# Patient Record
Sex: Female | Born: 1975 | Race: White | Hispanic: No | Marital: Single | Smoking: Never smoker
Health system: Southern US, Community
[De-identification: ages and names within clinical notes are randomized; demographics above are authoritative.]

## PROBLEM LIST (undated history)

## (undated) DIAGNOSIS — G4730 Sleep apnea, unspecified: Secondary | ICD-10-CM

## (undated) HISTORY — PX: CHOLECYSTECTOMY: SHX55

## (undated) HISTORY — DX: Sleep apnea, unspecified: G47.30

## (undated) HISTORY — PX: THYROID SURGERY: SHX805

## (undated) HISTORY — PX: TONSILLECTOMY: SUR1361

## (undated) HISTORY — PX: ABDOMINAL HYSTERECTOMY: SHX81

## (undated) HISTORY — PX: OOPHORECTOMY: SHX86

---

## 2005-07-18 ENCOUNTER — Ambulatory Visit: Payer: Self-pay | Admitting: Unknown Physician Specialty

## 2005-12-01 ENCOUNTER — Ambulatory Visit: Payer: Self-pay | Admitting: Unknown Physician Specialty

## 2006-01-10 ENCOUNTER — Ambulatory Visit: Payer: Self-pay | Admitting: Unknown Physician Specialty

## 2006-11-07 ENCOUNTER — Ambulatory Visit: Payer: Self-pay | Admitting: Unknown Physician Specialty

## 2007-04-03 ENCOUNTER — Ambulatory Visit: Payer: Self-pay | Admitting: Unknown Physician Specialty

## 2007-07-26 ENCOUNTER — Emergency Department: Payer: Self-pay | Admitting: Emergency Medicine

## 2007-09-28 ENCOUNTER — Ambulatory Visit: Payer: Self-pay | Admitting: Family Medicine

## 2007-10-30 ENCOUNTER — Ambulatory Visit: Payer: Self-pay | Admitting: Unknown Physician Specialty

## 2012-06-15 ENCOUNTER — Ambulatory Visit: Payer: Self-pay | Admitting: Unknown Physician Specialty

## 2012-10-09 ENCOUNTER — Ambulatory Visit: Payer: Self-pay | Admitting: Specialist

## 2012-10-15 ENCOUNTER — Ambulatory Visit: Payer: Self-pay | Admitting: Specialist

## 2012-11-09 ENCOUNTER — Ambulatory Visit: Payer: Self-pay | Admitting: Oncology

## 2012-12-08 ENCOUNTER — Ambulatory Visit: Payer: Self-pay | Admitting: Oncology

## 2014-08-08 ENCOUNTER — Other Ambulatory Visit: Payer: Self-pay | Admitting: Specialist

## 2014-08-08 ENCOUNTER — Ambulatory Visit
Admission: RE | Admit: 2014-08-08 | Discharge: 2014-08-08 | Disposition: A | Discharge status: Home / Self Care | Payer: 59 | Source: Ambulatory Visit | Attending: Specialist | Admitting: Specialist

## 2014-08-15 ENCOUNTER — Ambulatory Visit: Payer: 59 | Attending: Otolaryngology

## 2014-08-15 DIAGNOSIS — G4733 Obstructive sleep apnea (adult) (pediatric): Secondary | ICD-10-CM | POA: Insufficient documentation

## 2014-08-15 DIAGNOSIS — R0683 Snoring: Secondary | ICD-10-CM | POA: Insufficient documentation

## 2014-09-19 ENCOUNTER — Ambulatory Visit: Payer: 59 | Attending: Otolaryngology

## 2014-09-19 DIAGNOSIS — G4733 Obstructive sleep apnea (adult) (pediatric): Secondary | ICD-10-CM | POA: Insufficient documentation

## 2014-09-19 DIAGNOSIS — Z90710 Acquired absence of both cervix and uterus: Secondary | ICD-10-CM | POA: Diagnosis not present

## 2014-09-19 DIAGNOSIS — R0683 Snoring: Secondary | ICD-10-CM | POA: Insufficient documentation

## 2014-09-19 DIAGNOSIS — Z9049 Acquired absence of other specified parts of digestive tract: Secondary | ICD-10-CM | POA: Diagnosis not present

## 2014-09-25 ENCOUNTER — Ambulatory Visit: Payer: 59 | Admitting: Oncology

## 2014-10-07 ENCOUNTER — Encounter: Payer: Self-pay | Admitting: Oncology

## 2014-10-07 ENCOUNTER — Inpatient Hospital Stay: Payer: 59 | Attending: Oncology | Admitting: Oncology

## 2014-10-07 VITALS — BP 138/91 | HR 111 | Temp 96.7°F | Resp 20 | Ht 63.0 in | Wt 284.9 lb

## 2014-10-07 DIAGNOSIS — G4730 Sleep apnea, unspecified: Secondary | ICD-10-CM | POA: Diagnosis not present

## 2014-10-07 DIAGNOSIS — R748 Abnormal levels of other serum enzymes: Secondary | ICD-10-CM | POA: Insufficient documentation

## 2014-10-07 DIAGNOSIS — Z79899 Other long term (current) drug therapy: Secondary | ICD-10-CM

## 2014-10-07 DIAGNOSIS — R7989 Other specified abnormal findings of blood chemistry: Secondary | ICD-10-CM | POA: Diagnosis not present

## 2014-10-07 NOTE — Progress Notes (Signed)
Patient here today for new evaluation regarding elevated ferritin. Patient needs clearance prior to having bariatric surgery in the fall.

## 2014-10-12 NOTE — Progress Notes (Signed)
[facility]  [phone] Fax:([phone]) [phone]  ID: Tammy Soto OB: 27-Mar-1975  MR#: [id number]  AOZ#:[id number]  Patient Care Team: Patrice Paradise, MD as PCP - General (Physician Assistant)  CHIEF COMPLAINT:  Chief Complaint  Patient presents with  . New Evaluation    elevated ferritin, clearance for bariatric surgery    INTERVAL HISTORY: Patient last evaluated in clinic in October 2014. At that point, her bariatric surgery was delayed. Her surgery is now been rescheduled and she is referred back for a persistently elevated ferritin. She currently feels well and is asymptomatic.  She has no neurologic complaints.  She denies any recent fevers or illnesses.  She has a good appetite and denies weight loss.  She has no night sweats.  She denies any chest pain or shortness of breath.  She denies any nausea, vomiting, constipation, or diarrhea.  She has no urinary complaints.  Patient feels at her baseline and offers no specific complaints today.    REVIEW OF SYSTEMS:   Review of Systems  Constitutional: Negative.   Musculoskeletal: Negative.   Neurological: Negative.     As per HPI. Otherwise, a complete review of systems is negatve.  PAST MEDICAL HISTORY: Past Medical History  Diagnosis Date  . Sleep apnea     PAST SURGICAL HISTORY: Past Surgical History  Procedure Laterality Date  . Abdominal hysterectomy    . Cholecystectomy    . Tonsillectomy      FAMILY HISTORY: CAD, thyroid disease.     ADVANCED DIRECTIVES:    HEALTH MAINTENANCE: Social History  Substance Use Topics  . Smoking status: Not on file  . Smokeless tobacco: Not on file  . Alcohol Use: Not on file     Colonoscopy:  PAP:  Bone density:  Lipid panel:  Not on File  Current Outpatient Prescriptions  Medication Sig Dispense Refill  . cholecalciferol (VITAMIN D) 1000 UNITS tablet Take 1,000 Units by mouth daily.     No current facility-administered  medications for this visit.    OBJECTIVE: Filed Vitals:   10/07/14 1616  BP: 138/91  Pulse: 111  Temp: 96.7 F (35.9 C)  Resp: 20     Body mass index is 50.48 kg/(m^2).    ECOG FS:0 - Asymptomatic  General: Well-developed, well-nourished, no acute distress. Eyes: Pink conjunctiva, anicteric sclera. Lungs: Clear to auscultation bilaterally. Heart: Regular rate and rhythm. No rubs, murmurs, or gallops. Abdomen: Soft, nontender, nondistended. No organomegaly noted, normoactive bowel sounds. Musculoskeletal: No edema, cyanosis, or clubbing. Neuro: Alert, answering all questions appropriately. Cranial nerves grossly intact. Skin: No rashes or petechiae noted. Psych: Normal affect.   LAB RESULTS:  No results found for: NA, K, CL, CO2, GLUCOSE, BUN, CREATININE, CALCIUM, PROT, ALBUMIN, AST, ALT, ALKPHOS, BILITOT, GFRNONAA, GFRAA  No results found for: WBC, NEUTROABS, HGB, HCT, MCV, PLT   STUDIES: No results found.  ASSESSMENT: Elevated ferritin.  PLAN:    1. Elevated ferritin: Patient's ferritin level is only mildly elevated at 354 which is essentially unchanged from September 2014 when her ferritin measured 329. The remainder of her laboratory work including hemachromatosis gene mutation is either negative or within normal limits. Unclear etiology of elevated ferritin, but possibly be related to fatty infiltration of liver.  No intervention is needed. Okay to proceed with surgery from a hematology standpoint. No follow-up has been scheduled.  2. Elevated liver enzymes: Possibly related to fatty infiltration, monitor.  Approximately 30 minutes was spent in discussion and consultation.  Patient  expressed understanding and was in agreement with this plan. She also understands that She can call clinic at any time with any questions, concerns, or complaints.     Jeralyn Ruths, MD   10/12/2014 9:53 AM

## 2014-10-24 ENCOUNTER — Encounter: Payer: Self-pay | Admitting: *Deleted

## 2014-10-27 ENCOUNTER — Encounter: Payer: Self-pay | Admitting: *Deleted

## 2014-10-27 ENCOUNTER — Ambulatory Visit
Admission: RE | Admit: 2014-10-27 | Discharge: 2014-10-27 | Disposition: A | Discharge status: Home / Self Care | Payer: 59 | Source: Ambulatory Visit | Attending: Unknown Physician Specialty | Admitting: Unknown Physician Specialty

## 2014-10-27 ENCOUNTER — Ambulatory Visit: Payer: 59 | Admitting: Anesthesiology

## 2014-10-27 ENCOUNTER — Encounter
Admission: RE | Disposition: A | Discharge status: Home / Self Care | Payer: Self-pay | Source: Ambulatory Visit | Attending: Unknown Physician Specialty

## 2014-10-27 DIAGNOSIS — Z6841 Body Mass Index (BMI) 40.0 and over, adult: Secondary | ICD-10-CM | POA: Diagnosis not present

## 2014-10-27 DIAGNOSIS — Z79899 Other long term (current) drug therapy: Secondary | ICD-10-CM | POA: Diagnosis not present

## 2014-10-27 DIAGNOSIS — K319 Disease of stomach and duodenum, unspecified: Secondary | ICD-10-CM | POA: Diagnosis not present

## 2014-10-27 DIAGNOSIS — Z01818 Encounter for other preprocedural examination: Secondary | ICD-10-CM | POA: Diagnosis present

## 2014-10-27 DIAGNOSIS — Z90710 Acquired absence of both cervix and uterus: Secondary | ICD-10-CM | POA: Diagnosis not present

## 2014-10-27 DIAGNOSIS — G4730 Sleep apnea, unspecified: Secondary | ICD-10-CM | POA: Insufficient documentation

## 2014-10-27 DIAGNOSIS — Z9049 Acquired absence of other specified parts of digestive tract: Secondary | ICD-10-CM | POA: Insufficient documentation

## 2014-10-27 HISTORY — PX: ESOPHAGOGASTRODUODENOSCOPY (EGD) WITH PROPOFOL: SHX5813

## 2014-10-27 SURGERY — ESOPHAGOGASTRODUODENOSCOPY (EGD) WITH PROPOFOL
Anesthesia: General

## 2014-10-27 MED ORDER — LIDOCAINE HCL (PF) 2 % IJ SOLN
INTRAMUSCULAR | Status: DC | PRN
  Administered 2014-10-27: 50 mg

## 2014-10-27 MED ORDER — MIDAZOLAM HCL 5 MG/5ML IJ SOLN
INTRAMUSCULAR | Status: DC | PRN
  Administered 2014-10-27: 1 mg via INTRAVENOUS

## 2014-10-27 MED ORDER — GLYCOPYRROLATE 0.2 MG/ML IJ SOLN
INTRAMUSCULAR | Status: DC | PRN
  Administered 2014-10-27: 0.2 mg via INTRAVENOUS

## 2014-10-27 MED ORDER — PROPOFOL INFUSION 10 MG/ML OPTIME
INTRAVENOUS | Status: DC | PRN
  Administered 2014-10-27: 100 ug/kg/min via INTRAVENOUS

## 2014-10-27 MED ORDER — PROPOFOL 10 MG/ML IV BOLUS
INTRAVENOUS | Status: DC | PRN
  Administered 2014-10-27: 50 mg via INTRAVENOUS

## 2014-10-27 MED ORDER — SODIUM CHLORIDE 0.9 % IV SOLN: INTRAVENOUS | Status: DC

## 2014-10-27 MED ORDER — FENTANYL CITRATE (PF) 100 MCG/2ML IJ SOLN
INTRAMUSCULAR | Status: DC | PRN
  Administered 2014-10-27: 50 ug via INTRAVENOUS

## 2014-10-27 MED ORDER — SODIUM CHLORIDE 0.9 % IV SOLN
INTRAVENOUS | Status: DC
Start: 2014-10-27 — End: 2014-10-27
  Administered 2014-10-27: 11:00:00 via INTRAVENOUS

## 2014-10-27 NOTE — H&P (Signed)
   Primary Care Physician:  Patrice Paradise, MD Primary Gastroenterologist:  Dr. Mechele Collin  Pre-Procedure History & Physical: HPI:  Tammy Soto is a 39 y.o. female is here for an pre op upper endoscopy for bariatric surgery.   Past Medical History  Diagnosis Date  . Sleep apnea     Past Surgical History  Procedure Laterality Date  . Abdominal hysterectomy    . Cholecystectomy    . Tonsillectomy    . Thyroid surgery      Prior to Admission medications   Medication Sig Start Date End Date Taking? Authorizing Provider  cholecalciferol (VITAMIN D) 1000 UNITS tablet Take 1,000 Units by mouth daily.    Historical Provider, MD    Allergies as of 10/08/2014  . (Not on File)    History reviewed. No pertinent family history.  Social History   Social History  . Marital Status: Single    Spouse Name: N/A  . Number of Children: N/A  . Years of Education: N/A   Occupational History  . Not on file.   Social History Main Topics  . Smoking status: Never Smoker   . Smokeless tobacco: Never Used  . Alcohol Use: No  . Drug Use: No  . Sexual Activity: Not on file   Other Topics Concern  . Not on file   Social History Narrative    Review of Systems: See HPI, otherwise negative ROS  Physical Exam: BP 126/84 mmHg  Pulse 76  Temp(Src) 97.8 F (36.6 C) (Tympanic)  Resp 18  Ht  (1.6 m)  Wt 127.914 kg (282 lb)  BMI 49.97 kg/m2  SpO2 98% General:   Alert,  pleasant and cooperative in NAD Head:  Normocephalic and atraumatic. Neck:  Supple; no masses or thyromegaly. Lungs:  Clear throughout to auscultation.    Heart:  Regular rate and rhythm. Abdomen:  Soft, nontender and nondistended. Normal bowel sounds, without guarding, and without rebound.  Obesity in abdomen. Neurologic:  Alert and  oriented x4;  grossly normal neurologically.  Impression/Plan: Tammy Soto is here for an endoscopy to be performed for pre op evaluation for bariatric  surgery  Risks, benefits, limitations, and alternatives regarding  endoscopy have been reviewed with the patient.  Questions have been answered.  All parties agreeable.   Lynnae Prude, MD  10/27/2014, 11:22 AM

## 2014-10-27 NOTE — Op Note (Addendum)
[facility] Patient Name: Tammy Soto Procedure Date: 10/27/2014 11:16 AM MRN: [id number] Account #: [id number] Date of Birth: 1975/12/19 Admit Type: Outpatient Age: 39 Room: [facility] ENDO ROOM 1 Gender: Female Note Status: Supervisor Override Procedure:         Upper GI endoscopy Indications:       Preoperative assessment for bariatric surgery to treat                     morbid obesity Providers:         Scot Jun, MD Referring MD:      Marilynne Halsted, MD (Referring MD) Medicines:         Propofol per Anesthesia Complications:     No immediate complications. Procedure:         Pre-Anesthesia Assessment:                    - After reviewing the risks and benefits, the patient was                     deemed in satisfactory condition to undergo the procedure.                    After obtaining informed consent, the endoscope was passed                     under direct vision. Throughout the procedure, the                     patient's blood pressure, pulse, and oxygen saturations                     were monitored continuously. The Endoscope was introduced                     through the mouth, and advanced to the second part of                     duodenum. The upper GI endoscopy was accomplished without                     difficulty. The patient tolerated the procedure well. Findings:      The examined esophagus was normal. Biopsies were taken with a cold       forceps for histology. GEJ 39-40cm.      Diffuse mildly-minimal erythematous mucosa without bleeding was found in       the gastric body. Biopsies were taken with a cold forceps for histology.       Biopsies were taken with a cold forceps for Helicobacter pylori testing.       Done from antrum, body greater and lesser curvature.      The examined duodenum was normal. Impression:        - Normal esophagus. Biopsied.                    - Erythematous mucosa in the gastric  body. Biopsied.                    - Normal examined duodenum. Recommendation:    - Await pathology results. Scot Jun, MD 10/27/2014 11:43:43 AM This report has been signed electronically. Number of Addenda: 0 Note Initiated On: 10/27/2014 11:16 AM      [facility]

## 2014-10-27 NOTE — Transfer of Care (Signed)
Immediate Anesthesia Transfer of Care Note  Patient: Tammy Soto  Procedure(s) Performed: Procedure(s): ESOPHAGOGASTRODUODENOSCOPY (EGD) WITH PROPOFOL (N/A)  Patient Location: PACU  Anesthesia Type:General  Level of Consciousness: sedated  Airway & Oxygen Therapy: Patient Spontanous Breathing and Patient connected to nasal cannula oxygen  Post-op Assessment: Report given to RN and Post -op Vital signs reviewed and stable  Post vital signs: Reviewed and stable  Last Vitals:  Filed Vitals:   10/27/14 1108  BP: 126/84  Pulse: 76  Temp: 36.6 C  Resp: 18    Complications: No apparent anesthesia complications

## 2014-10-27 NOTE — Anesthesia Preprocedure Evaluation (Signed)
Anesthesia Evaluation  Patient identified by MRN, date of birth, ID band Patient awake    Reviewed: Allergy & Precautions, NPO status , Patient's Chart, lab work & pertinent test results  Airway Mallampati: I  TM Distance: >3 FB Neck ROM: Full    Dental  (+) Teeth Intact   Pulmonary sleep apnea ,  Does not have her CPAP unit yet.   breath sounds clear to auscultation       Cardiovascular negative cardio ROS Normal cardiovascular exam Rhythm:Regular Rate:Normal     Neuro/Psych    GI/Hepatic negative GI ROS, BMI 50, pre-op screening for gastric sleeve surgery.   Endo/Other  negative endocrine ROS  Renal/GU      Musculoskeletal   Abdominal (+) + obese,   Peds  Hematology   Anesthesia Other Findings   Reproductive/Obstetrics                             Anesthesia Physical Anesthesia Plan  ASA: III  Anesthesia Plan: General   Post-op Pain Management:    Induction: Intravenous  Airway Management Planned: Nasal Cannula  Additional Equipment:   Intra-op Plan:   Post-operative Plan:   Informed Consent:   Plan Discussed with: CRNA  Anesthesia Plan Comments:         Anesthesia Quick Evaluation

## 2014-10-27 NOTE — Anesthesia Postprocedure Evaluation (Signed)
  Anesthesia Post-op Note  Patient: Tammy Soto  Procedure(s) Performed: Procedure(s): ESOPHAGOGASTRODUODENOSCOPY (EGD) WITH PROPOFOL (N/A)  Anesthesia type:General  Patient location: PACU  Post pain: Pain level controlled  Post assessment: Post-op Vital signs reviewed, Patient's Cardiovascular Status Stable, Respiratory Function Stable, Patent Airway and No signs of Nausea or vomiting  Post vital signs: Reviewed and stable  Last Vitals:  Filed Vitals:   10/27/14 1200  BP: 103/71  Pulse: 68  Temp:   Resp: 16    Level of consciousness: awake, alert  and patient cooperative  Complications: No apparent anesthesia complications

## 2014-10-28 LAB — SURGICAL PATHOLOGY

## 2014-10-29 ENCOUNTER — Encounter: Payer: Self-pay | Admitting: Unknown Physician Specialty

## 2015-09-11 ENCOUNTER — Other Ambulatory Visit: Payer: Self-pay | Admitting: Student

## 2015-09-11 DIAGNOSIS — R945 Abnormal results of liver function studies: Principal | ICD-10-CM

## 2015-09-11 DIAGNOSIS — K760 Fatty (change of) liver, not elsewhere classified: Secondary | ICD-10-CM

## 2015-09-11 DIAGNOSIS — R7989 Other specified abnormal findings of blood chemistry: Secondary | ICD-10-CM

## 2015-09-17 ENCOUNTER — Ambulatory Visit
Admission: RE | Admit: 2015-09-17 | Discharge: 2015-09-17 | Disposition: A | Discharge status: Home / Self Care | Payer: 59 | Source: Ambulatory Visit | Attending: Student | Admitting: Student

## 2015-09-17 DIAGNOSIS — R7989 Other specified abnormal findings of blood chemistry: Secondary | ICD-10-CM | POA: Diagnosis present

## 2015-09-17 DIAGNOSIS — R932 Abnormal findings on diagnostic imaging of liver and biliary tract: Secondary | ICD-10-CM | POA: Diagnosis not present

## 2015-09-17 DIAGNOSIS — Z9049 Acquired absence of other specified parts of digestive tract: Secondary | ICD-10-CM | POA: Insufficient documentation

## 2015-09-17 DIAGNOSIS — R945 Abnormal results of liver function studies: Secondary | ICD-10-CM

## 2015-09-17 DIAGNOSIS — K760 Fatty (change of) liver, not elsewhere classified: Secondary | ICD-10-CM | POA: Diagnosis not present

## 2016-09-08 ENCOUNTER — Other Ambulatory Visit: Payer: Self-pay | Admitting: Physician Assistant

## 2016-09-08 DIAGNOSIS — Z1231 Encounter for screening mammogram for malignant neoplasm of breast: Secondary | ICD-10-CM

## 2016-11-28 ENCOUNTER — Other Ambulatory Visit: Payer: Self-pay | Admitting: Physician Assistant

## 2016-11-28 ENCOUNTER — Ambulatory Visit
Admission: RE | Admit: 2016-11-28 | Discharge: 2016-11-28 | Disposition: A | Discharge status: Home / Self Care | Payer: 59 | Source: Ambulatory Visit | Attending: Physician Assistant | Admitting: Physician Assistant

## 2016-11-28 DIAGNOSIS — Z1231 Encounter for screening mammogram for malignant neoplasm of breast: Secondary | ICD-10-CM | POA: Insufficient documentation

## 2017-10-19 ENCOUNTER — Ambulatory Visit
Admission: RE | Admit: 2017-10-19 | Discharge: 2017-10-19 | Disposition: A | Discharge status: Home / Self Care | Payer: Managed Care, Other (non HMO) | Source: Ambulatory Visit | Attending: Physician Assistant | Admitting: Physician Assistant

## 2017-10-19 ENCOUNTER — Other Ambulatory Visit: Payer: Self-pay | Admitting: Physician Assistant

## 2017-10-19 DIAGNOSIS — R202 Paresthesia of skin: Secondary | ICD-10-CM

## 2017-10-19 DIAGNOSIS — R448 Other symptoms and signs involving general sensations and perceptions: Secondary | ICD-10-CM

## 2017-10-20 ENCOUNTER — Ambulatory Visit
Admission: RE | Admit: 2017-10-20 | Discharge: 2017-10-20 | Disposition: A | Discharge status: Home / Self Care | Payer: Managed Care, Other (non HMO) | Source: Ambulatory Visit | Attending: Physician Assistant | Admitting: Physician Assistant

## 2017-10-20 DIAGNOSIS — R202 Paresthesia of skin: Secondary | ICD-10-CM | POA: Insufficient documentation

## 2017-10-20 DIAGNOSIS — G319 Degenerative disease of nervous system, unspecified: Secondary | ICD-10-CM | POA: Insufficient documentation

## 2017-10-20 DIAGNOSIS — R448 Other symptoms and signs involving general sensations and perceptions: Secondary | ICD-10-CM | POA: Insufficient documentation

## 2017-11-24 ENCOUNTER — Other Ambulatory Visit: Payer: Self-pay | Admitting: Physician Assistant

## 2017-11-24 DIAGNOSIS — Z1231 Encounter for screening mammogram for malignant neoplasm of breast: Secondary | ICD-10-CM

## 2017-12-19 ENCOUNTER — Ambulatory Visit
Admission: RE | Admit: 2017-12-19 | Discharge: 2017-12-19 | Disposition: A | Discharge status: Home / Self Care | Payer: Managed Care, Other (non HMO) | Source: Ambulatory Visit | Attending: Physician Assistant | Admitting: Physician Assistant

## 2017-12-19 DIAGNOSIS — Z1231 Encounter for screening mammogram for malignant neoplasm of breast: Secondary | ICD-10-CM | POA: Insufficient documentation

## 2018-01-01 ENCOUNTER — Other Ambulatory Visit: Payer: Self-pay | Admitting: Neurology

## 2018-01-01 DIAGNOSIS — R200 Anesthesia of skin: Secondary | ICD-10-CM

## 2018-01-17 ENCOUNTER — Ambulatory Visit: Admission: RE | Admit: 2018-01-17 | Payer: Managed Care, Other (non HMO) | Source: Ambulatory Visit

## 2018-11-06 ENCOUNTER — Other Ambulatory Visit: Payer: Self-pay | Admitting: Physician Assistant

## 2018-11-06 DIAGNOSIS — Z1231 Encounter for screening mammogram for malignant neoplasm of breast: Secondary | ICD-10-CM

## 2018-12-26 ENCOUNTER — Other Ambulatory Visit: Payer: Self-pay

## 2018-12-26 ENCOUNTER — Ambulatory Visit
Admission: RE | Admit: 2018-12-26 | Discharge: 2018-12-26 | Disposition: A | Discharge status: Home / Self Care | Payer: Managed Care, Other (non HMO) | Source: Ambulatory Visit | Attending: Physician Assistant | Admitting: Physician Assistant

## 2018-12-26 DIAGNOSIS — Z1231 Encounter for screening mammogram for malignant neoplasm of breast: Secondary | ICD-10-CM | POA: Insufficient documentation

## 2018-12-26 DIAGNOSIS — Z20822 Contact with and (suspected) exposure to covid-19: Secondary | ICD-10-CM

## 2018-12-28 ENCOUNTER — Telehealth: Payer: Self-pay

## 2018-12-28 LAB — NOVEL CORONAVIRUS, NAA: SARS-CoV-2, NAA: DETECTED — AB

## 2018-12-28 NOTE — Telephone Encounter (Signed)
Pt notified of positive COVID-19 test results. Pt verbalized understanding. Pt reports that that she has cough and sinus drainage.Pt advised to remain in self quarantine until at least 10 days since symptom onset And 3 consecutive days fever free without antipyretics And improvement in respiratory symptoms. Patient advised to utilize over the counter medications to treat symptoms. Pt advised to seek treatment in the ED if respiratory issues/distress develops.Pt advised they should only leave home to seek and medical care and must wear a mask in public. Pt instructed to limit contact with family members or caregivers in the home. Pt advised to practice social distancing and to continue to use good preventative care measures such has frequent hand washing, staying out of crowds and cleaning hard surfaces frequently touched in the home.Pt informed that the health department will likely follow up and may have additional recommendations. Will notify [facility] Department.

## 2019-02-20 ENCOUNTER — Emergency Department: Payer: Managed Care, Other (non HMO)

## 2019-02-20 ENCOUNTER — Encounter: Payer: Self-pay | Admitting: Emergency Medicine

## 2019-02-20 ENCOUNTER — Other Ambulatory Visit: Payer: Self-pay

## 2019-02-20 ENCOUNTER — Emergency Department
Admission: EM | Admit: 2019-02-20 | Discharge: 2019-02-20 | Disposition: A | Discharge status: Home / Self Care | Payer: Managed Care, Other (non HMO) | Attending: Emergency Medicine | Admitting: Emergency Medicine

## 2019-02-20 DIAGNOSIS — Z79899 Other long term (current) drug therapy: Secondary | ICD-10-CM | POA: Insufficient documentation

## 2019-02-20 DIAGNOSIS — R079 Chest pain, unspecified: Secondary | ICD-10-CM | POA: Insufficient documentation

## 2019-02-20 DIAGNOSIS — M79662 Pain in left lower leg: Secondary | ICD-10-CM | POA: Insufficient documentation

## 2019-02-20 DIAGNOSIS — R2242 Localized swelling, mass and lump, left lower limb: Secondary | ICD-10-CM | POA: Diagnosis present

## 2019-02-20 LAB — BASIC METABOLIC PANEL
Anion gap: 6 (ref 5–15)
BUN: 14 mg/dL (ref 6–20)
CO2: 28 mmol/L (ref 22–32)
Calcium: 9.3 mg/dL (ref 8.9–10.3)
Chloride: 106 mmol/L (ref 98–111)
Creatinine, Ser: 0.78 mg/dL (ref 0.44–1.00)
GFR calc Af Amer: >60 mL/min (ref >60)
GFR calc non Af Amer: >60 mL/min (ref >60)
Glucose, Bld: 92 mg/dL (ref 70–99)
Potassium: 4.2 mmol/L (ref 3.5–5.1)
Sodium: 140 mmol/L (ref 135–145)

## 2019-02-20 LAB — CBC
HCT: 40.2 % (ref 36.0–46.0)
Hemoglobin: 13.3 g/dL (ref 12.0–15.0)
MCH: 29.1 pg (ref 26.0–34.0)
MCHC: 33.1 g/dL (ref 30.0–36.0)
MCV: 88 fL (ref 80.0–100.0)
Platelets: 211 10*3/uL (ref 150–400)
RBC: 4.57 MIL/uL (ref 3.87–5.11)
RDW: 12.2 % (ref 11.5–15.5)
WBC: 4.8 10*3/uL (ref 4.0–10.5)
nRBC: 0 % (ref 0.0–0.2)

## 2019-02-20 LAB — TROPONIN I (HIGH SENSITIVITY): Troponin I (High Sensitivity): <2 ng/L (ref <18)

## 2019-02-20 NOTE — ED Triage Notes (Signed)
Pt reports yesterday started with swelling to her left leg frokm her knee down and also has some tightness in her chest. Pt reports occasional tingling in her right arm. Pt reports went to [facility] and was brought to ED.

## 2019-02-20 NOTE — ED Provider Notes (Signed)
[facility] Emergency Department Provider Note  ____________________________________________  Time seen: Approximately 8:06 PM  I have reviewed the triage vital signs and the nursing notes.   HISTORY  Chief Complaint Leg Swelling and Chest Pain    HPI Tammy Soto is a 44 y.o. female with no significant past medical history who reports being in her usual state of health until yesterday when she started having some swelling in her left leg and noticing pain in the left anterior shin extending down to the ankle.  Hurts to point ankle down or up.  No alleviating factors.  Pain is constant.  No calf pain.  No significant acute chest pain or shortness of breath contrary to triage note -she does get an occasional brief tightness in her chest which has been a chronic issue for her and not changed recently.   Tried elevating the leg last night without relief.  No recent surgeries or long travel or immobility.     Past Medical History:  Diagnosis Date  . Sleep apnea      There are no problems to display for this patient.    Past Surgical History:  Procedure Laterality Date  . ABDOMINAL HYSTERECTOMY    . CHOLECYSTECTOMY    . ESOPHAGOGASTRODUODENOSCOPY (EGD) WITH PROPOFOL N/A 10/27/2014   Procedure: ESOPHAGOGASTRODUODENOSCOPY (EGD) WITH PROPOFOL;  Surgeon: Scot Jun, MD;  Location: [facility] ENDOSCOPY;  Service: Endoscopy;  Laterality: N/A;  . OOPHORECTOMY    . THYROID SURGERY    . TONSILLECTOMY       Prior to Admission medications   Medication Sig Start Date End Date Taking? Authorizing Provider  cholecalciferol (VITAMIN D) 1000 UNITS tablet Take 1,000 Units by mouth daily.    [provider]     Allergies Patient has no known allergies.   Family History  Problem Relation Age of Onset  . Breast cancer Other     Social History Social History   Tobacco Use  . Smoking status: Never Smoker  . Smokeless tobacco: Never Used   Substance Use Topics  . Alcohol use: No  . Drug use: No    Review of Systems  Constitutional:   No fever or chills.  ENT:   No sore throat. No rhinorrhea. Cardiovascular:   No chest pain or syncope. Respiratory:   No dyspnea or cough. Gastrointestinal:   Negative for abdominal pain, vomiting and diarrhea.  Musculoskeletal:   Left lower leg pain as above All other systems reviewed and are negative except as documented above in ROS and HPI.  ____________________________________________   PHYSICAL EXAM:  VITAL SIGNS: ED Triage Vitals  Enc Vitals Group     BP 02/20/19 1646 110/72     Pulse Rate 02/20/19 1646 70     Resp 02/20/19 1646 20     Temp 02/20/19 1646 98.5 F (36.9 C)     Temp Source 02/20/19 1646 Oral     SpO2 02/20/19 1646 100 %     Weight 02/20/19 1631 174 lb (78.9 kg)     Height 02/20/19 1631 5\' 3"  (1.6 m)     Head Circumference --      Peak Flow --      Pain Score 02/20/19 1631 8     Pain Loc --      Pain Edu? --      Excl. in GC? --     Vital signs reviewed, nursing assessments reviewed.   Constitutional:   Alert and oriented. Non-toxic appearance. Eyes:  Conjunctivae are normal on left.  Right eye bulbar conjunctiva with hazy erythema consistent with irritant conjunctivitis.  No purulent discharge, cornea does not appear steamy. EOMI and painless.  ENT      Head:   Normocephalic and atraumatic.      Nose:   Wearing a mask.      Mouth/Throat:   Wearing a mask.      Neck:   No meningismus. Full ROM. Hematological/Lymphatic/Immunilogical:   No cervical lymphadenopathy. Cardiovascular:   RRR. Symmetric bilateral radial and DP pulses.  No murmurs. Cap refill less than 2 seconds. Respiratory:   Normal respiratory effort without tachypnea/retractions. Breath sounds are clear and equal bilaterally. No wheezes/rales/rhonchi. Gastrointestinal:   Soft and nontender. Non distended. There is no CVA tenderness.  No rebound, rigidity, or guarding. Musculoskeletal:    Normal range of motion in all extremities. No joint effusions.   Nonpitting edema bilateral lower extremities, symmetric calf circumference, no calf tenderness.  There is tenderness along the left anterior shin just medial to the tibia which reproduces her pain.  Passive plantarflexion reproduces pain.  Isokinetic dorsiflexion against resistance reproduces pain.  Compartments are soft. Neurologic:   Normal speech and language.  Motor grossly intact. No acute focal neurologic deficits are appreciated.  Skin:    Skin is warm, dry and intact. No rash noted.  No petechiae, purpura, or bullae.  ____________________________________________    LABS (pertinent positives/negatives) (all labs ordered are listed, but only abnormal results are displayed) Labs Reviewed  BASIC METABOLIC PANEL  CBC  TROPONIN I (HIGH SENSITIVITY)  TROPONIN I (HIGH SENSITIVITY)   ____________________________________________   EKG  Interpreted by me Sinus rhythm rate of 77, normal axis and intervals.  Normal QRS ST segments and T waves.  Baseline wander in the limb leads limits interpretation, but grossly no ischemic changes.  ____________________________________________    RADIOLOGY  DG Chest 2 View  Result Date: 02/20/2019 CLINICAL DATA:  Chest pain EXAM: CHEST - 2 VIEW COMPARISON:  08/08/2014 FINDINGS: Heart and mediastinal contours are within normal limits. No focal opacities or effusions. No acute bony abnormality. IMPRESSION: No active cardiopulmonary disease. Electronically Signed   By: Charlett Nose M.D.   On: 02/20/2019 17:56   US Venous Img Lower Unilateral Left  Result Date: 02/20/2019 CLINICAL DATA:  Swelling and pain of left leg EXAM: LEFT LOWER EXTREMITY VENOUS DOPPLER ULTRASOUND TECHNIQUE: Gray-scale sonography with graded compression, as well as color Doppler and duplex ultrasound were performed to evaluate the lower extremity deep venous systems from the level of the common femoral vein and  including the common femoral, femoral, profunda femoral, popliteal and calf veins including the posterior tibial, peroneal and gastrocnemius veins when visible. The superficial great saphenous vein was also interrogated. Spectral Doppler was utilized to evaluate flow at rest and with distal augmentation maneuvers in the common femoral, femoral and popliteal veins. COMPARISON:  None. FINDINGS: Contralateral Common Femoral Vein: Respiratory phasicity is normal and symmetric with the symptomatic side. No evidence of thrombus. Normal compressibility. Common Femoral Vein: No evidence of thrombus. Normal compressibility, respiratory phasicity and response to augmentation. Saphenofemoral Junction: No evidence of thrombus. Normal compressibility and flow on color Doppler imaging. Profunda Femoral Vein: No evidence of thrombus. Normal compressibility and flow on color Doppler imaging. Femoral Vein: No evidence of thrombus. Normal compressibility, respiratory phasicity and response to augmentation. Popliteal Vein: No evidence of thrombus. Normal compressibility, respiratory phasicity and response to augmentation. Calf Veins: No evidence of thrombus. Normal compressibility and flow on color  Doppler imaging. Superficial Great Saphenous Vein: No evidence of thrombus. Normal compressibility. Venous Reflux:  None. Other Findings:  None. IMPRESSION: No evidence of deep venous thrombosis. Electronically Signed   By: Rolm Baptise M.D.   On: 02/20/2019 17:47    ____________________________________________   PROCEDURES Procedures  ____________________________________________  DIFFERENTIAL DIAGNOSIS   Electrolyte disturbance, DVT, musculoskeletal pain  CLINICAL IMPRESSION / ASSESSMENT AND PLAN / ED COURSE  Medications ordered in the ED: Medications - No data to display  Pertinent labs & imaging results that were available during my care of the patient were reviewed by me and considered in my medical decision making  (see chart for details).  Brier G Giannelli was evaluated in Emergency Department on 02/20/2019 for the symptoms described in the history of present illness. She was evaluated in the context of the global COVID-19 pandemic, which necessitated consideration that the patient might be at risk for infection with the SARS-CoV-2 virus that causes COVID-19. Institutional protocols and algorithms that pertain to the evaluation of patients at risk for COVID-19 are in a state of rapid change based on information released by regulatory bodies including the CDC and federal and state organizations. These policies and algorithms were followed during the patient's care in the ED.   Patient presents with left lower leg pain.  Vital signs are normal, exam is reassuring and suggestive of musculoskeletal origin.  No focal inflammatory changes or unilateral swelling.  Ultrasound is negative for DVT.  Chest x-ray and lab panel also normal.  I think patient is stable for discharge home, heat therapy and NSAIDs, follow-up with primary care doctor.      ____________________________________________   FINAL CLINICAL IMPRESSION(S) / ED DIAGNOSES    Final diagnoses:  Pain in left shin     ED Discharge Orders    None      Portions of this note were generated with dragon dictation software. Dictation errors may occur despite best attempts at proofreading.   Carrie Mew, MD 02/20/19 2013

## 2019-02-20 NOTE — ED Notes (Signed)
Pt c/o left leg swelling and chest tightness x2 days. Pt denies and n/v.

## 2019-10-29 ENCOUNTER — Other Ambulatory Visit: Payer: Self-pay | Admitting: Physician Assistant

## 2019-10-29 DIAGNOSIS — Z1231 Encounter for screening mammogram for malignant neoplasm of breast: Secondary | ICD-10-CM

## 2019-12-27 ENCOUNTER — Ambulatory Visit
Admission: RE | Admit: 2019-12-27 | Discharge: 2019-12-27 | Disposition: A | Discharge status: Home / Self Care | Payer: Managed Care, Other (non HMO) | Source: Ambulatory Visit | Attending: Physician Assistant | Admitting: Physician Assistant

## 2019-12-27 ENCOUNTER — Other Ambulatory Visit: Payer: Self-pay

## 2019-12-27 DIAGNOSIS — Z1231 Encounter for screening mammogram for malignant neoplasm of breast: Secondary | ICD-10-CM | POA: Diagnosis present

## 2020-05-19 LAB — SURGICAL PATHOLOGY

## 2020-10-15 ENCOUNTER — Other Ambulatory Visit: Payer: Self-pay | Admitting: Physician Assistant

## 2020-11-02 ENCOUNTER — Other Ambulatory Visit: Payer: Self-pay | Admitting: Family Medicine

## 2020-11-02 DIAGNOSIS — Z1231 Encounter for screening mammogram for malignant neoplasm of breast: Secondary | ICD-10-CM

## 2021-01-04 ENCOUNTER — Other Ambulatory Visit: Payer: Self-pay

## 2021-01-04 ENCOUNTER — Ambulatory Visit
Admission: RE | Admit: 2021-01-04 | Discharge: 2021-01-04 | Disposition: A | Discharge status: Home / Self Care | Payer: Managed Care, Other (non HMO) | Source: Ambulatory Visit | Attending: Family Medicine | Admitting: Family Medicine

## 2021-01-04 DIAGNOSIS — Z1231 Encounter for screening mammogram for malignant neoplasm of breast: Secondary | ICD-10-CM | POA: Diagnosis not present

## 2021-03-29 ENCOUNTER — Other Ambulatory Visit: Payer: Self-pay | Admitting: Internal Medicine

## 2021-03-31 LAB — SURGICAL PATHOLOGY

## 2021-10-20 ENCOUNTER — Other Ambulatory Visit: Payer: Self-pay | Admitting: Physician Assistant

## 2021-10-20 DIAGNOSIS — Z1231 Encounter for screening mammogram for malignant neoplasm of breast: Secondary | ICD-10-CM

## 2022-01-05 ENCOUNTER — Ambulatory Visit
Admission: RE | Admit: 2022-01-05 | Discharge: 2022-01-05 | Disposition: A | Discharge status: Home / Self Care | Payer: Managed Care, Other (non HMO) | Source: Ambulatory Visit | Attending: Physician Assistant | Admitting: Physician Assistant

## 2022-01-05 DIAGNOSIS — Z1231 Encounter for screening mammogram for malignant neoplasm of breast: Secondary | ICD-10-CM | POA: Diagnosis present

## 2022-01-11 ENCOUNTER — Other Ambulatory Visit: Payer: Self-pay | Admitting: Physician Assistant

## 2022-01-11 DIAGNOSIS — N63 Unspecified lump in unspecified breast: Secondary | ICD-10-CM

## 2022-01-11 DIAGNOSIS — R928 Other abnormal and inconclusive findings on diagnostic imaging of breast: Secondary | ICD-10-CM

## 2022-01-13 ENCOUNTER — Ambulatory Visit
Admission: RE | Admit: 2022-01-13 | Discharge: 2022-01-13 | Disposition: A | Discharge status: Home / Self Care | Payer: Managed Care, Other (non HMO) | Source: Ambulatory Visit | Attending: Physician Assistant | Admitting: Physician Assistant

## 2022-01-13 DIAGNOSIS — R928 Other abnormal and inconclusive findings on diagnostic imaging of breast: Secondary | ICD-10-CM | POA: Diagnosis present

## 2022-01-13 DIAGNOSIS — N63 Unspecified lump in unspecified breast: Secondary | ICD-10-CM | POA: Diagnosis present

## 2022-06-28 IMAGING — MG MM DIGITAL SCREENING BILAT W/ TOMO AND CAD
8 series · 8 of 24 positions shown · non-contrast
Comparison: Previous exam(s).

CLINICAL DATA: Screening.

EXAM:
DIGITAL SCREENING BILATERAL MAMMOGRAM WITH TOMOSYNTHESIS AND CAD
TECHNIQUE: Bilateral screening digital craniocaudal and mediolateral oblique
mammograms were obtained. Bilateral screening digital breast
tomosynthesis was performed. The images were evaluated with
computer-aided detection.

[L CC synth-2D]
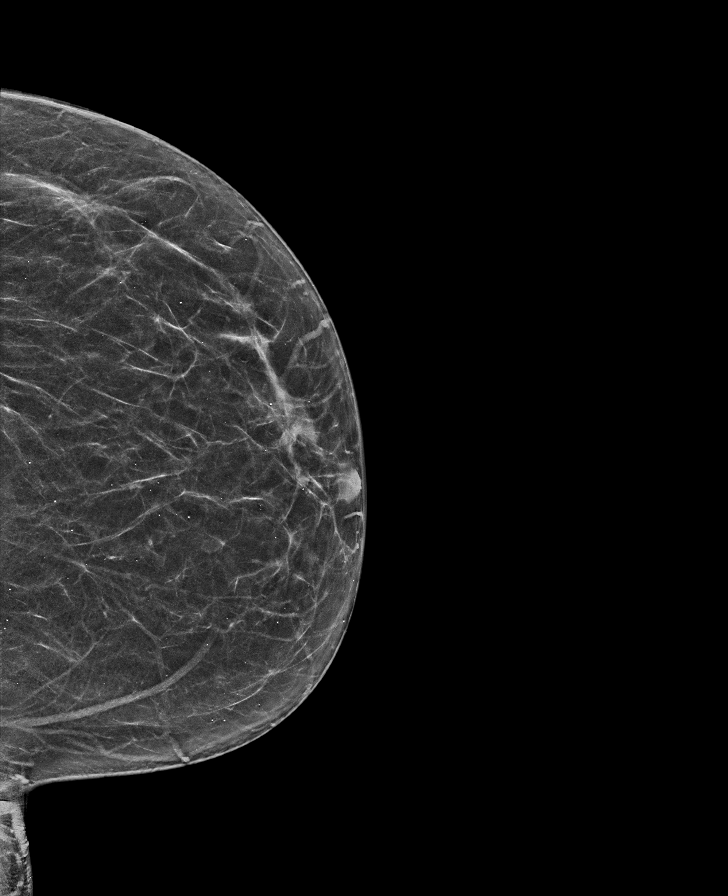

[L MLO synth-2D]
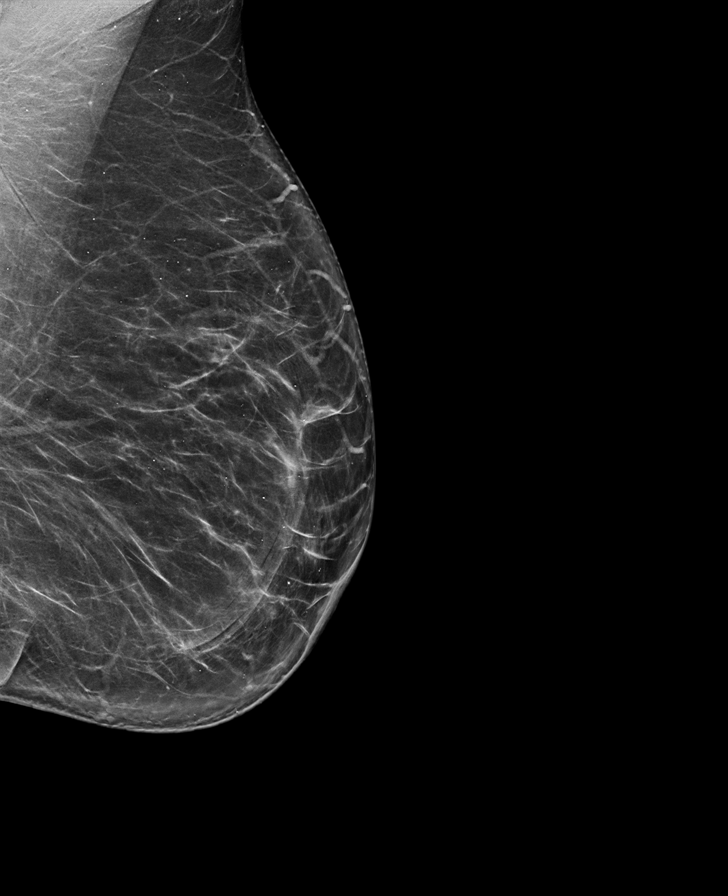

[R MLO synth-2D]
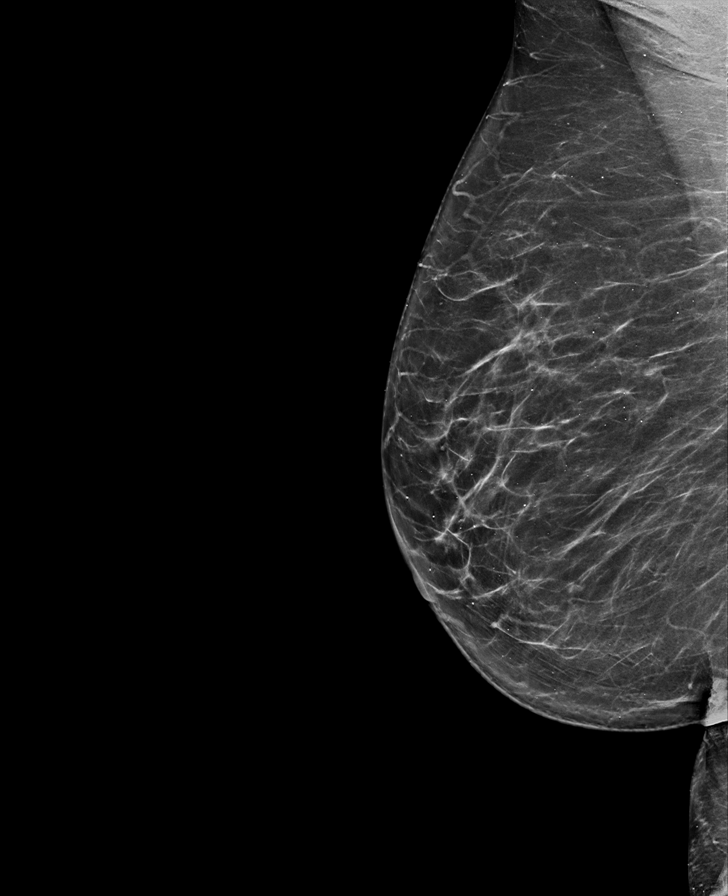

[R CC synth-2D]
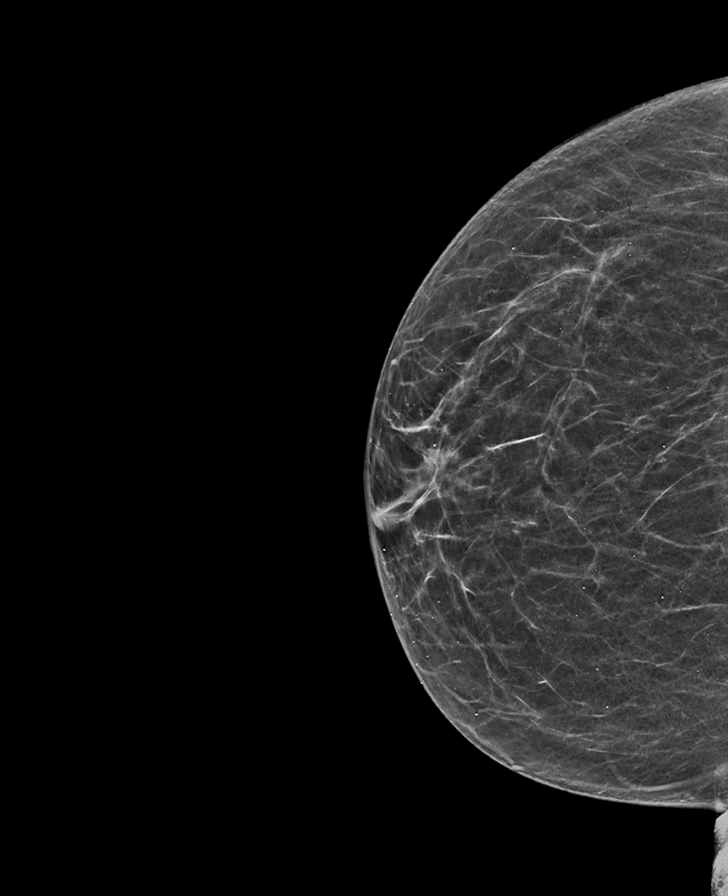

[R CC tomo · tomo slice 31/60.0]
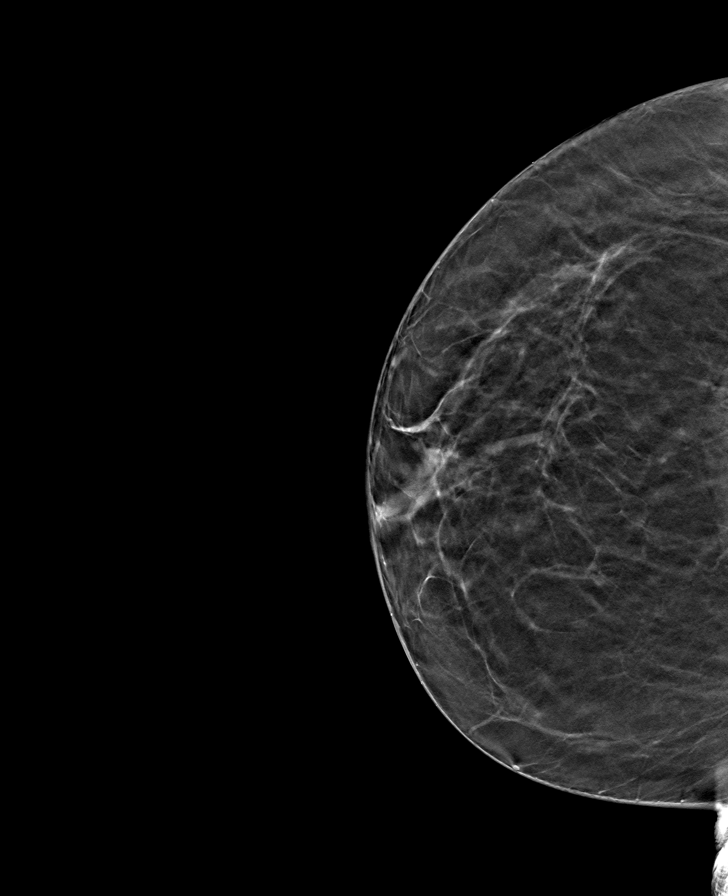

[R MLO tomo · tomo slice 41/81.0]
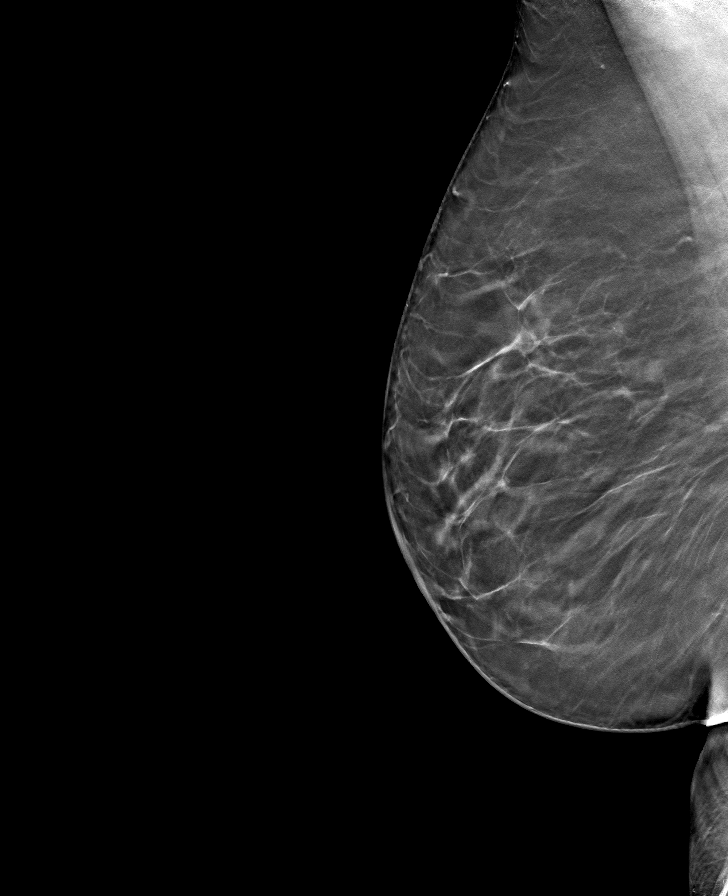

[L MLO tomo · tomo slice 42/83.0]
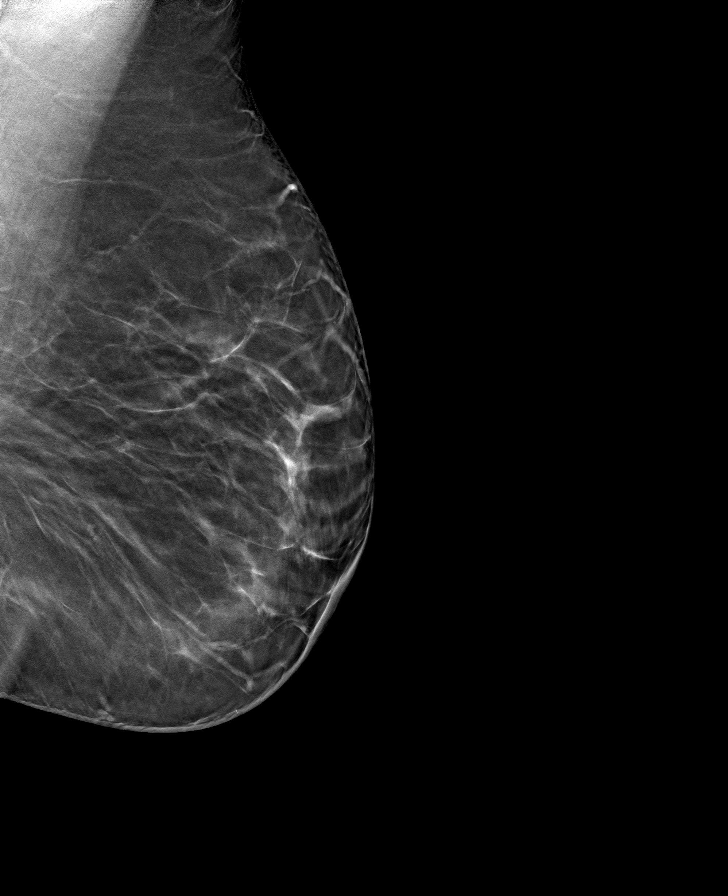

[L CC tomo · tomo slice 33/65.0]
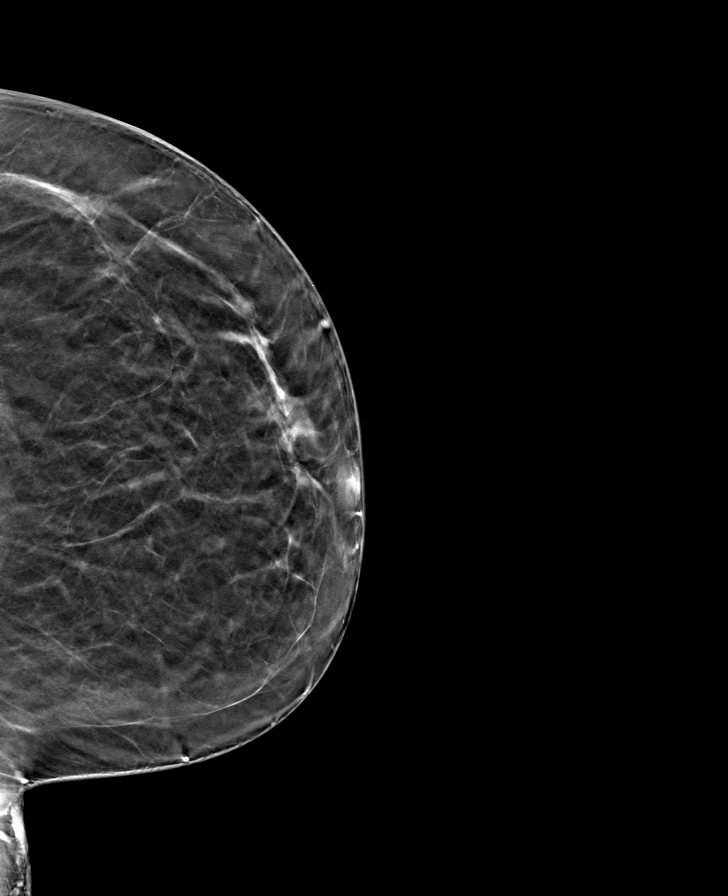

[8 of 24 positions shown; findings below may reference images not displayed]

ACR Breast Density Category b: There are scattered areas of
fibroglandular density.
FINDINGS: There are no findings suspicious for malignancy.
IMPRESSION: No mammographic evidence of malignancy. A result letter of this
screening mammogram will be mailed directly to the patient.

RECOMMENDATION:
Screening mammogram in one year. (Code:51-O-LD2)

BI-RADS CATEGORY  1: Negative.

## 2022-12-12 ENCOUNTER — Other Ambulatory Visit: Payer: Self-pay | Admitting: Physician Assistant

## 2022-12-12 DIAGNOSIS — Z1231 Encounter for screening mammogram for malignant neoplasm of breast: Secondary | ICD-10-CM

## 2023-01-11 ENCOUNTER — Ambulatory Visit
Admission: RE | Admit: 2023-01-11 | Discharge: 2023-01-11 | Disposition: A | Discharge status: Home / Self Care | Payer: Managed Care, Other (non HMO) | Source: Ambulatory Visit | Attending: Physician Assistant | Admitting: Physician Assistant

## 2023-01-11 DIAGNOSIS — Z1231 Encounter for screening mammogram for malignant neoplasm of breast: Secondary | ICD-10-CM | POA: Insufficient documentation

## 2023-04-03 ENCOUNTER — Encounter: Payer: Self-pay | Admitting: Emergency Medicine

## 2023-04-03 ENCOUNTER — Ambulatory Visit
Admission: EM | Admit: 2023-04-03 | Discharge: 2023-04-03 | Disposition: A | Discharge status: Home / Self Care | Payer: Managed Care, Other (non HMO) | Attending: Emergency Medicine | Admitting: Emergency Medicine

## 2023-04-03 ENCOUNTER — Other Ambulatory Visit: Payer: Self-pay

## 2023-04-03 DIAGNOSIS — J101 Influenza due to other identified influenza virus with other respiratory manifestations: Secondary | ICD-10-CM | POA: Diagnosis not present

## 2023-04-03 LAB — POC COVID19/FLU A&B COMBO
Covid Antigen, POC: NEGATIVE
Influenza A Antigen, POC: POSITIVE — AB
Influenza B Antigen, POC: NEGATIVE

## 2023-04-03 MED ORDER — OSELTAMIVIR PHOSPHATE 75 MG PO CAPS: 75.0000 mg | ORAL_CAPSULE | Freq: Two times a day (BID) | ORAL | 0 refills | Status: AC

## 2023-04-03 MED ORDER — CYCLOBENZAPRINE HCL 5 MG PO TABS: 5.0000 mg | ORAL_TABLET | Freq: Three times a day (TID) | ORAL | 0 refills | Status: AC | PRN

## 2023-04-03 MED ORDER — PROMETHAZINE-DM 6.25-15 MG/5ML PO SYRP: 5.0000 mL | ORAL_SOLUTION | Freq: Every evening | ORAL | 0 refills | Status: AC | PRN

## 2023-04-03 MED ORDER — BENZONATATE 100 MG PO CAPS: 100.0000 mg | ORAL_CAPSULE | Freq: Three times a day (TID) | ORAL | 0 refills | Status: AC

## 2023-04-03 NOTE — ED Provider Notes (Signed)
 Renaldo Fiddler    CSN: [id number] Arrival date & time: 04/03/23  1020      History   Chief Complaint Chief Complaint  Patient presents with   URI    HPI Tammy Soto is a 48 y.o. female.   Patient presents for evaluation of nasal congestion, rhinorrhea, and primarily nonproductive cough, sore throat, intermittent headaches and generalized body aches present for 3 days.  Symptoms worsening overnight, interfering with sleep, body aches described as a full body burning sensation.  Poor appetite but able to tolerate some food and liquids.  Has attempted Robitussin and Mucinex.  Known sick contacts. Denies sob or wheezing.   Past Medical History:  Diagnosis Date   Sleep apnea     There are no active problems to display for this patient.   Past Surgical History:  Procedure Laterality Date   ABDOMINAL HYSTERECTOMY     CHOLECYSTECTOMY     ESOPHAGOGASTRODUODENOSCOPY (EGD) WITH PROPOFOL N/A 10/27/2014   Procedure: ESOPHAGOGASTRODUODENOSCOPY (EGD) WITH PROPOFOL;  Surgeon: Scot Jun, MD;  Location: [facility] ENDOSCOPY;  Service: Endoscopy;  Laterality: N/A;   OOPHORECTOMY     THYROID SURGERY     TONSILLECTOMY      OB History   No obstetric history on file.      Home Medications    Prior to Admission medications   Medication Sig Start Date End Date Taking? Authorizing Provider  benzonatate (TESSALON) 100 MG capsule Take 1 capsule (100 mg total) by mouth every 8 (eight) hours. 04/03/23  Yes Calob Baskette, Elita Boone, NP  cyclobenzaprine (FLEXERIL) 5 MG tablet Take 1 tablet (5 mg total) by mouth 3 (three) times daily as needed for muscle spasms. 04/03/23  Yes Alexiz Sustaita, Elita Boone, NP  oseltamivir (TAMIFLU) 75 MG capsule Take 1 capsule (75 mg total) by mouth every 12 (twelve) hours. 04/03/23  Yes Dafney Farler R, NP  promethazine-dextromethorphan (PROMETHAZINE-DM) 6.25-15 MG/5ML syrup Take 5 mLs by mouth at bedtime as needed. 04/03/23  Yes Zayden Maffei R, NP  cholecalciferol  (VITAMIN D) 1000 UNITS tablet Take 1,000 Units by mouth daily.    [provider]    Family History Family History  Problem Relation Age of Onset   Breast cancer Other     Social History Social History   Tobacco Use   Smoking status: Never   Smokeless tobacco: Never  Substance Use Topics   Alcohol use: No   Drug use: No     Allergies   Patient has no known allergies.   Review of Systems Review of Systems   Physical Exam Triage Vital Signs ED Triage Vitals  Encounter Vitals Group     BP 04/03/23 1108 110/75     Systolic BP Percentile --      Diastolic BP Percentile --      Pulse Rate 04/03/23 1108 (!) 109     Resp 04/03/23 1108 18     Temp 04/03/23 1108 (!) 100.7 F (38.2 C)     Temp Source 04/03/23 1108 Oral     SpO2 04/03/23 1108 98 %     Weight --      Height --      Head Circumference --      Peak Flow --      Pain Score 04/03/23 1106 9     Pain Loc --      Pain Education --      Exclude from Growth Chart --    No data found.  Updated Vital  Signs BP 110/75 (BP Location: Left Arm)   Pulse (!) 109   Temp (!) 100.7 F (38.2 C) (Oral)   Resp 18   SpO2 98%   Visual Acuity Right Eye Distance:   Left Eye Distance:   Bilateral Distance:    Right Eye Near:   Left Eye Near:    Bilateral Near:     Physical Exam Constitutional:      Appearance: Normal appearance.  HENT:     Right Ear: Tympanic membrane, ear canal and external ear normal.     Left Ear: Tympanic membrane, ear canal and external ear normal.     Nose: Congestion present. No rhinorrhea.     Mouth/Throat:     Pharynx: No oropharyngeal exudate or posterior oropharyngeal erythema.  Eyes:     Extraocular Movements: Extraocular movements intact.  Cardiovascular:     Rate and Rhythm: Normal rate and regular rhythm.     Pulses: Normal pulses.     Heart sounds: Normal heart sounds.  Pulmonary:     Effort: Pulmonary effort is normal.     Breath sounds: Normal breath sounds.   Neurological:     Mental Status: She is alert and oriented to person, place, and time. Mental status is at baseline.      UC Treatments / Results  Labs (all labs ordered are listed, but only abnormal results are displayed) Labs Reviewed  POC COVID19/FLU A&B COMBO - Abnormal; Notable for the following components:      Result Value   Influenza A Antigen, POC Positive (*)    All other components within normal limits    EKG   Radiology No results found.  Procedures Procedures (including critical care time)  Medications Ordered in UC Medications - No data to display  Initial Impression / Assessment and Plan / UC Course  I have reviewed the triage vital signs and the nursing notes.  Pertinent labs & imaging results that were available during my care of the patient were reviewed by me and considered in my medical decision making (see chart for details).  Influenza A  Patient is in no signs of distress nor toxic appearing.  Vital signs are stable.  Low suspicion for pneumonia, pneumothorax or bronchitis and therefore will defer imaging.  COVID Test negative.  Prescribed Tamiflu, Flexeril, Tessalon and Promethazine DM. May use additional over-the-counter medications as needed for supportive care.  May follow-up with urgent care as needed if symptoms persist or worsen.   Final Clinical Impressions(s) / UC Diagnoses   Final diagnoses:  Influenza A     Discharge Instructions      Influenza A is a virus and should steadily improve in time it can take up to 7 to 10 days before you truly start to see a turnaround however things will get better  Begin Tamiflu every morning and every evening for 5 days to reduce the amount of virus in the body which helps to minimize symptoms  May use muscle relaxant every 8 hours as needed for body aches  May use Tessalon pill every 8 hours as needed for cough, may use cough syrup at bedtime to help you rest    You can take Tylenol and/or  Ibuprofen as needed for fever reduction and pain relief.   For cough: honey 1/2 to 1 teaspoon (you can dilute the honey in water or another fluid).  You can also use guaifenesin and dextromethorphan for cough. You can use a humidifier for chest congestion and cough.  If you don't have a humidifier, you can sit in the bathroom with the hot shower running.      For sore throat: try warm salt water gargles, cepacol lozenges, throat spray, warm tea or water with lemon/honey, popsicles or ice, or OTC cold relief medicine for throat discomfort.   For congestion: take a daily anti-histamine like Zyrtec, Claritin, and a oral decongestant, such as pseudoephedrine.  You can also use Flonase 1-2 sprays in each nostril daily.   It is important to stay hydrated: drink plenty of fluids (water, gatorade/powerade/pedialyte, juices, or teas) to keep your throat moisturized and help further relieve irritation/discomfort.    ED Prescriptions     Medication Sig Dispense Auth. Provider   benzonatate (TESSALON) 100 MG capsule Take 1 capsule (100 mg total) by mouth every 8 (eight) hours. 21 capsule Henlee Donovan R, NP   promethazine-dextromethorphan (PROMETHAZINE-DM) 6.25-15 MG/5ML syrup Take 5 mLs by mouth at bedtime as needed. 118 mL Lalaine Overstreet, Hansel Starling R, NP   oseltamivir (TAMIFLU) 75 MG capsule Take 1 capsule (75 mg total) by mouth every 12 (twelve) hours. 10 capsule Shiryl Ruddy R, NP   cyclobenzaprine (FLEXERIL) 5 MG tablet Take 1 tablet (5 mg total) by mouth 3 (three) times daily as needed for muscle spasms. 20 tablet Valinda Hoar, NP      PDMP not reviewed this encounter.   Valinda Hoar, NP 04/03/23 1152

## 2023-04-03 NOTE — Discharge Instructions (Signed)
 Influenza A is a virus and should steadily improve in time it can take up to 7 to 10 days before you truly start to see a turnaround however things will get better  Begin Tamiflu every morning and every evening for 5 days to reduce the amount of virus in the body which helps to minimize symptoms  May use muscle relaxant every 8 hours as needed for body aches  May use Tessalon pill every 8 hours as needed for cough, may use cough syrup at bedtime to help you rest    You can take Tylenol and/or Ibuprofen as needed for fever reduction and pain relief.   For cough: honey 1/2 to 1 teaspoon (you can dilute the honey in water or another fluid).  You can also use guaifenesin and dextromethorphan for cough. You can use a humidifier for chest congestion and cough.  If you don't have a humidifier, you can sit in the bathroom with the hot shower running.      For sore throat: try warm salt water gargles, cepacol lozenges, throat spray, warm tea or water with lemon/honey, popsicles or ice, or OTC cold relief medicine for throat discomfort.   For congestion: take a daily anti-histamine like Zyrtec, Claritin, and a oral decongestant, such as pseudoephedrine.  You can also use Flonase 1-2 sprays in each nostril daily.   It is important to stay hydrated: drink plenty of fluids (water, gatorade/powerade/pedialyte, juices, or teas) to keep your throat moisturized and help further relieve irritation/discomfort.

## 2023-04-03 NOTE — ED Triage Notes (Addendum)
 Patient presents to [facility] For evaluation of cough x 3 days, rib pain from coughing.  Patient states she is improving, but says the bodyaches are still pretty bad.  Patient states the body aches are so bad she was pacing the floor last night.  Mucinex and tylenol so far for symptoms without much relief.

## 2023-11-09 ENCOUNTER — Other Ambulatory Visit: Payer: Self-pay | Admitting: Physician Assistant

## 2023-11-09 DIAGNOSIS — Z1231 Encounter for screening mammogram for malignant neoplasm of breast: Secondary | ICD-10-CM

## 2024-01-15 ENCOUNTER — Ambulatory Visit
Admission: RE | Admit: 2024-01-15 | Discharge: 2024-01-15 | Disposition: A | Source: Ambulatory Visit | Attending: Physician Assistant | Admitting: Physician Assistant

## 2024-01-15 DIAGNOSIS — Z1231 Encounter for screening mammogram for malignant neoplasm of breast: Secondary | ICD-10-CM
